# Patient Record
Sex: Female | Born: 2000 | Race: Black or African American | Hispanic: No | Marital: Single | Smoking: Never smoker
Health system: Southern US, Community
[De-identification: ages and names within clinical notes are randomized; demographics above are authoritative.]

## PROBLEM LIST (undated history)

## (undated) DIAGNOSIS — D649 Anemia, unspecified: Secondary | ICD-10-CM

## (undated) HISTORY — PX: NO PAST SURGERIES: SHX2092

---
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
## 2022-12-24 ENCOUNTER — Ambulatory Visit
Admission: EM | Admit: 2022-12-24 | Discharge: 2022-12-24 | Disposition: A | Discharge status: Home / Self Care | Payer: Self-pay | Attending: Urgent Care | Admitting: Urgent Care

## 2022-12-24 DIAGNOSIS — R55 Syncope and collapse: Secondary | ICD-10-CM

## 2022-12-24 DIAGNOSIS — F411 Generalized anxiety disorder: Secondary | ICD-10-CM

## 2022-12-24 LAB — POCT FASTING CBG KUC MANUAL ENTRY: POCT Glucose (KUC): 129 mg/dL — AB (ref 70–99)

## 2022-12-24 MED ORDER — SODIUM CHLORIDE 0.9 % IV BOLUS
1000.0000 mL | Freq: Once | INTRAVENOUS | Status: AC
  Administered 2022-12-24: 1000 mL via INTRAVENOUS

## 2022-12-24 NOTE — ED Provider Notes (Addendum)
Diane Manning    CSN: [id number] Arrival date & time: 12/24/22  1308      History   Chief Complaint Chief Complaint  Patient presents with   Loss of Consciousness    HPI Diane Manning is a 22 y.o. female.    Loss of Consciousness   Patient is accompanied by her brother.  They present to urgent care with complaint of two witnessed syncopal events this morning, the first at work about an hour ago.  She is unsure if she hit her head.  Her brother states that she was evaluated on the scene by EMS but refused transport because she "thought she would get better".  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  She reports feeling weak, nausea.  She endorses headache this morning and using NSAIDs.  She reports previous history of similar symptoms and "stop taking her iron pills". She reports anemia that was discovered by a provider at a routine visit. Denies heavy menstrual periods. Denies dietary restrictions.  She states her symptoms occurred today when she was walking down a fragrance aisle.  She reports sometimes being overcome by heavy fragrances.  She did not eat breakfast this morning but states that is normal for her.  She reports drinking normal amounts of fluids.  She states her symptoms are also sometimes driven by stress and anxiety and panic.  Past Medical History:  Diagnosis Date   Anemia     There are no problems to display for this patient.   History reviewed. No pertinent surgical history.  OB History   No obstetric history on file.      Home Medications    Prior to Admission medications   Medication Sig Start Date End Date Taking? Authorizing Provider  amoxicillin (AMOXIL) 500 MG capsule Take 1 capsule (500 mg total) by mouth 3 (three) times daily. Patient not taking: Reported on 12/24/2022 10/30/21   Elson Areas, PA-C  pediatric multivitamin + iron (POLY-VI-SOL +IRON) 10 MG/ML oral solution Take by mouth  daily. Patient not taking: Reported on 12/24/2022    [provider]    Family History Family History  Problem Relation Age of Onset   Healthy Mother    Healthy Father    Healthy Brother     Social History Social History   Tobacco Use   Smoking status: Never   Smokeless tobacco: Never  Vaping Use   Vaping Use: Never used  Substance Use Topics   Alcohol use: No   Drug use: No     Allergies   Patient has no known allergies.   Review of Systems Review of Systems  Cardiovascular:  Positive for syncope.     Physical Exam Triage Vital Signs ED Triage Vitals  Enc Vitals Group     BP 12/24/22 0932 117/78     Pulse Rate 12/24/22 0932 82     Resp 12/24/22 0932 17     Temp 12/24/22 0936 98.7 F (37.1 C)     Temp src --      SpO2 12/24/22 0932 98 %     Weight --      Height --      Head Circumference --      Peak Flow --      Pain Score 12/24/22 0933 0     Pain Loc --      Pain Edu? --      Excl. in [place]? --    No data  found.  Updated Vital Signs BP 119/80   Pulse 78   Temp 98.7 F (37.1 C)   Resp 18   LMP 12/22/2022   SpO2 98%   Visual Acuity Right Eye Distance:   Left Eye Distance:   Bilateral Distance:    Right Eye Near:   Left Eye Near:    Bilateral Near:     Physical Exam Constitutional:      Appearance: She is ill-appearing.  Cardiovascular:     Rate and Rhythm: Normal rate and regular rhythm.     Pulses: Normal pulses.     Heart sounds: Normal heart sounds.  Pulmonary:     Effort: Pulmonary effort is normal.     Breath sounds: Normal breath sounds.  Skin:    General: Skin is warm and dry.  Neurological:     General: No focal deficit present.     Mental Status: She is alert and oriented to person, place, and time.  Psychiatric:        Mood and Affect: Mood normal.        Behavior: Behavior normal.      UC Treatments / Results  Labs (all labs ordered are listed, but only abnormal results are displayed) Labs Reviewed   POCT FASTING CBG [facility] MANUAL ENTRY - Abnormal; Notable for the following components:      Result Value   POCT Glucose ([facility]) 129 (*)    All other components within normal limits    EKG   Radiology No results found.  Procedures Procedures (including critical care time)  Medications Ordered in UC Medications  sodium chloride 0.9 % bolus 1,000 mL (1,000 mLs Intravenous New Bag/Given 12/24/22 0949)    Initial Impression / Assessment and Plan / UC Course  I have reviewed the triage vital signs and the nursing notes.  Pertinent labs & imaging results that were available during my care of the patient were reviewed by me and considered in my medical decision making (see chart for details).   Diane Manning is a 22 y.o. female presenting with syncope. Patient is afebrile without recent antipyretics, satting well on room air. Overall is ill appearing though non-toxic, well hydrated, without respiratory distress. Pulmonary exam is unremarkable.  Lungs CTAB without wheezing, rhonchi, rales. RRR.  ECG is reassuring with normal sinus rhythm.  Ventricular rate of 80 bpm was recorded.  No patient history of chronic issues is in chart.  Presumed IDA history given she states she was using iron supplementation.  Recommending she restart iron supplementation and follow-up with PCP evaluation for IDA.  Stated scheduling an establish care appointment.  Also recommended she seek evaluation at a behavioral health center for anxiety and panic.  Patient was discharged after infusion of 1 L normal saline.  She states she is feeling better.  Counseled patient on potential for adverse effects with medications prescribed/recommended today, ER and return-to-clinic precautions discussed, patient verbalized understanding and agreement with care plan.   Final Clinical Impressions(s) / UC Diagnoses   Final diagnoses:  Syncope and collapse   Discharge Instructions   None    ED Prescriptions   None    PDMP  not reviewed this encounter.   Charma Igo, FNP 12/24/22 1020    ImmordinoJeannett Senior, FNP 12/24/22 1021

## 2022-12-24 NOTE — Discharge Instructions (Addendum)
Recommend eating some food in the morning before work.  Please schedule a visit with your primary care doctor for full evaluation including possible anemia.  I would recommend that you schedule an evaluation at a behavioral health center for your anxiety and panic.

## 2022-12-24 NOTE — ED Notes (Signed)
Appointment scheduled for patient to establish PCP. Patient scheduled with Mort Sawyers NP on August 1st @ 8am.

## 2022-12-24 NOTE — ED Notes (Signed)
Able to ambulate with light assistance from brother upon discharge.

## 2022-12-24 NOTE — ED Triage Notes (Addendum)
Patient to Urgent Care with complaints of two witnessed syncopal events while at work this morning. Unsure if she hit her head- does not remember events. At the time patient was evaluated by [facility] but refused transport to the ER.   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 Patient weak/ nauseated and ill-appearing. A/Ox4. States she had a headache this morning and took some ibuprofen.  Reports history of the same- states she stopped taking her iron pills 3-4 months ago.

## 2023-01-21 ENCOUNTER — Ambulatory Visit
Admission: EM | Admit: 2023-01-21 | Discharge: 2023-01-21 | Disposition: A | Discharge status: Home / Self Care | Payer: Self-pay

## 2023-01-21 DIAGNOSIS — Z7689 Persons encountering health services in other specified circumstances: Secondary | ICD-10-CM

## 2023-01-21 DIAGNOSIS — R55 Syncope and collapse: Secondary | ICD-10-CM

## 2023-01-21 NOTE — ED Provider Notes (Signed)
Renaldo Fiddler    CSN: [id number] Arrival date & time: 01/21/23  4098      History   Chief Complaint No chief complaint on file.   HPI Diane Manning is a 22 y.o. female.   Patient presents for evaluation for a note requesting to return to work after syncopal episode that occurred on 12/24/2022.  Evaluated in this urgent care, deemed stable at that time, restarted on iron tablets due to history of iron deficiency anemia.  Also recommended at that time to follow-up with behavioral health due to known anxiety that she believes may have been contributing to symptoms.  Has follow-up appointment with primary care, unable to be seen until August.  Intermittently still experiencing dizziness but denies additional syncopal episodes.  Past Medical History:  Diagnosis Date   Anemia     There are no problems to display for this patient.   History reviewed. No pertinent surgical history.  OB History   No obstetric history on file.      Home Medications    Prior to Admission medications   Medication Sig Start Date End Date Taking? Authorizing Provider  ferrous sulfate 325 (65 FE) MG EC tablet Take 325 mg by mouth 3 (three) times daily with meals.   Yes [provider]  ibuprofen (ADVIL) 200 MG tablet Take 200 mg by mouth every 6 (six) hours as needed.   Yes [provider]  amoxicillin (AMOXIL) 500 MG capsule Take 1 capsule (500 mg total) by mouth 3 (three) times daily. Patient not taking: Reported on 12/24/2022 10/30/21   Elson Areas, PA-C  pediatric multivitamin + iron (POLY-VI-SOL +IRON) 10 MG/ML oral solution Take by mouth daily. Patient not taking: Reported on 12/24/2022    [provider]    Family History Family History  Problem Relation Age of Onset   Healthy Mother    Healthy Father    Healthy Brother     Social History Social History   Tobacco Use   Smoking status: Never   Smokeless tobacco: Never  Vaping Use   Vaping Use: Never  used  Substance Use Topics   Alcohol use: No   Drug use: No     Allergies   Patient has no known allergies.   Review of Systems Review of Systems   Physical Exam Triage Vital Signs ED Triage Vitals  Enc Vitals Group     BP 01/21/23 0832 115/75     Pulse Rate 01/21/23 0832 77     Resp 01/21/23 0832 19     Temp 01/21/23 0832 98.6 F (37 C)     Temp Source 01/21/23 0832 Oral     SpO2 01/21/23 0832 98 %     Weight --      Height --      Head Circumference --      Peak Flow --      Pain Score 01/21/23 0833 0     Pain Loc --      Pain Edu? --      Excl. in [place]? --    Orthostatic VS for the past 24 hrs:  BP- Lying Pulse- Lying BP- Sitting Pulse- Sitting BP- Standing at 0 minutes Pulse- Standing at 0 minutes  01/21/23 0838 111/74 74 107/74 78 119/82 83  01/21/23 0835 -- -- -- 77 -- 105    Updated Vital Signs BP 115/75 (BP Location: Right Arm)   Pulse 77   Temp 98.6 F (37 C) (Oral)  Resp 19   LMP 01/21/2023 (Exact Date)   SpO2 98%   Visual Acuity Right Eye Distance:   Left Eye Distance:   Bilateral Distance:    Right Eye Near:   Left Eye Near:    Bilateral Near:     Physical Exam Constitutional:      Appearance: Normal appearance.  Eyes:     Extraocular Movements: Extraocular movements intact.  Cardiovascular:     Rate and Rhythm: Normal rate and regular rhythm.     Pulses: Normal pulses.     Heart sounds: Normal heart sounds.  Pulmonary:     Effort: Pulmonary effort is normal.     Breath sounds: Normal breath sounds.  Neurological:     Mental Status: She is alert and oriented to person, place, and time. Mental status is at baseline.      UC Treatments / Results  Labs (all labs ordered are listed, but only abnormal results are displayed) Labs Reviewed - No data to display  EKG   Radiology No results found.  Procedures Procedures (including critical care time)  Medications Ordered in UC Medications - No data to display  Initial  Impression / Assessment and Plan / UC Course  I have reviewed the triage vital signs and the nursing notes.  Pertinent labs & imaging results that were available during my care of the patient were reviewed by me and considered in my medical decision making (see chart for details).  Syncope and collapse, return to work evaluation  Vital signs are stable and patient is in no signs of distress nor toxic appearing, vital signs are stable, orthostatic vital signs negative, on initial triage able to see fluctuation in heart rate with increasing at least 30 beats when changing from a sitting to lying position, recommended follow-up with cardiology and walking referral given, advised keeping upcoming PCP appointment and continuation of iron supplements, may follow-up with this urgent care as needed for any concerning symptoms Final Clinical Impressions(s) / UC Diagnoses   Final diagnoses:  Syncope and collapse  Return to work evaluation     Discharge Instructions      Change has been noticed when you change positions from sitting to lying, heart rate increased significantly since you have recently passed out twice, I would like you to follow-up with cardiology for further evaluation, please keep upcoming primary care appointment as well  Continue take your iron tablets as directed  You may return to work and follow-up with his urgent care as needed   ED Prescriptions   None    PDMP not reviewed this encounter.   Valinda Hoar, [place] 01/21/23 [phone]

## 2023-01-21 NOTE — Discharge Instructions (Addendum)
Change has been noticed when you change positions from sitting to lying, heart rate increased significantly since you have recently passed out twice, I would like you to follow-up with cardiology for further evaluation, please keep upcoming primary care appointment as well  Continue take your iron tablets as directed  You may return to work and follow-up with his urgent care as needed

## 2023-01-21 NOTE — ED Triage Notes (Signed)
Pt presents to get doctors note to return to work. Pt states she feels comfortable going back to work. No recent passing out in past 2 weeks. Does still have headaches and feeling dizzy at times if she get up to fast but no passing out or feeling of going to pass out.

## 2023-03-17 ENCOUNTER — Ambulatory Visit (INDEPENDENT_AMBULATORY_CARE_PROVIDER_SITE_OTHER): Payer: Self-pay | Admitting: Family

## 2023-03-17 ENCOUNTER — Encounter: Payer: Self-pay | Admitting: Family

## 2023-03-17 VITALS — BP 100/66 | HR 84 | Temp 98.4°F | Ht 66.5 in | Wt 130.0 lb

## 2023-03-17 DIAGNOSIS — D500 Iron deficiency anemia secondary to blood loss (chronic): Secondary | ICD-10-CM

## 2023-03-17 DIAGNOSIS — R42 Dizziness and giddiness: Secondary | ICD-10-CM

## 2023-03-17 DIAGNOSIS — R55 Syncope and collapse: Secondary | ICD-10-CM

## 2023-03-17 DIAGNOSIS — R000 Tachycardia, unspecified: Secondary | ICD-10-CM

## 2023-03-17 LAB — CBC
HCT: 34.8 % — ABNORMAL LOW (ref 35.0–45.0)
Hemoglobin: 11 g/dL — ABNORMAL LOW (ref 11.7–15.5)
MCH: 25.9 pg — ABNORMAL LOW (ref 27.0–33.0)
MCHC: 31.6 g/dL — ABNORMAL LOW (ref 32.0–36.0)
MCV: 81.9 fL (ref 80.0–100.0)
MPV: 11.4 fL (ref 7.5–12.5)
Platelets: 181 10*3/uL (ref 140–400)
RBC: 4.25 10*6/uL (ref 3.80–5.10)
RDW: 16.5 % — ABNORMAL HIGH (ref 11.0–15.0)
WBC: 4.8 10*3/uL (ref 3.8–10.8)

## 2023-03-17 NOTE — Assessment & Plan Note (Signed)
Recurrent Reviewed 12/24/22 EKG NSR  Pt given information for cardiology offices to call  Self pay labs today, ordered b12 cbc bmp tsh and ibc ferritin pending results.  Encouraged pt to eat every 2-3 hours suspect hypoglycemia from lack of eating throughout the day.

## 2023-03-17 NOTE — Progress Notes (Signed)
Established Patient Office Visit  Subjective:  Patient ID: Eyvonne Teichman, female    DOB: 22-Apr-2001  Age: 22 y.o. MRN: [id number]  CC:  Chief Complaint  Patient presents with   Establish Care    Patient states she was seen in ER in June and was told to get a Rx for Iron from her PCP    HPI Aily Alonso is here for a transition of care visit.  Prior provider was: Dr. Tana Conch [facility] in [place].   Recently found to have IDA in the urgent care at [place]. She went to ER due to syncope and collapse.  Had initially went to urgent care and had a near syncopal event there as well.  She had had two witnessed syncopal events this morning, she states prior to episodes she has blurred vision and fees her chest get tight and then she passes out. She reports similar episodes prior and feels they are due to her stopping the iron pills. No heavy menses. She states she often skips meals and at   Yesterday at work she did lose her balance and her co worker had to catch her from falling.  She states typically if she sits down and cools off after about thirty minutes she will feel better but she states dizziness has becoming more so. She states yesterday during the episode her teeth felt 'fuzzy' as well.   She does often have repeated anxiety panic attacks even when these episodes are not occurring.   She does state this happened about four years ago for the first time.   She did go to the urgent care in [place] again 6/7 for similar episode. EKG was performed in ER and was seen to have fluctuation in heart rate with elevation of heart rate when changing from sitting to lying position, was given referral to a cardiologist.        Past Medical History:  Diagnosis Date   Anemia     Past Surgical History:  Procedure Laterality Date   NO PAST SURGERIES      Family History  Problem Relation Age of Onset   Healthy Mother    Healthy Father    Healthy Brother     Social History    Socioeconomic History   Marital status: Single    Spouse name: Not on file   Number of children: 0   Years of education: Not on file   Highest education level: Not on file  Occupational History   Occupation: [employer]  Tobacco Use   Smoking status: Never   Smokeless tobacco: Never  Vaping Use   Vaping status: Never Used  Substance and Sexual Activity   Alcohol use: No   Drug use: Yes    Types: Marijuana    Comment: occasionally   Sexual activity: Not Currently    Partners: Male    Birth control/protection: Condom  Other Topics Concern   Not on file  Social History Narrative   Not on file   Social Determinants of Health   Financial Resource Strain: Not on file  Food Insecurity: Not on file  Transportation Needs: Not on file  Physical Activity: Not on file  Stress: Not on file  Social Connections: Not on file  Intimate Partner Violence: Not on file    Outpatient Medications Prior to Visit  Medication Sig Dispense Refill   ibuprofen (ADVIL) 200 MG tablet Take 200 mg by mouth every 6 (six) hours as needed.     amoxicillin (AMOXIL)  500 MG capsule Take 1 capsule (500 mg total) by mouth 3 (three) times daily. (Patient not taking: Reported on 12/24/2022) 30 capsule 0   ferrous sulfate 325 (65 FE) MG EC tablet Take 325 mg by mouth 3 (three) times daily with meals.     pediatric multivitamin + iron (POLY-VI-SOL +IRON) 10 MG/ML oral solution Take by mouth daily. (Patient not taking: Reported on 12/24/2022)     No facility-administered medications prior to visit.    No Known Allergies  ROS: Pertinent symptoms negative unless otherwise noted in HPI      Objective:    Physical Exam Vitals reviewed.  Constitutional:      Appearance: Normal appearance.  Eyes:     General:        Right eye: No discharge.        Left eye: No discharge.     Conjunctiva/sclera: Conjunctivae normal.  Cardiovascular:     Rate and Rhythm: Normal rate.  Pulmonary:     Effort: Pulmonary  effort is normal. No respiratory distress.  Musculoskeletal:        General: Normal range of motion.     Cervical back: Normal range of motion.  Neurological:     General: No focal deficit present.     Mental Status: She is alert and oriented to person, place, and time. Mental status is at baseline.     Cranial Nerves: Cranial nerves 2-12 are intact.  Psychiatric:        Mood and Affect: Mood normal.        Behavior: Behavior normal.        Thought Content: Thought content normal.        Judgment: Judgment normal.       BP 100/66 (BP Location: Left Arm, Patient Position: Sitting, Cuff Size: Normal)   Pulse 84   Temp 98.4 F (36.9 C) (Temporal)   Ht 5' 6.5" (1.689 m)   Wt 130 lb (59 kg)   LMP 02/17/2023 (Approximate)   SpO2 99%   BMI 20.67 kg/m  Wt Readings from Last 3 Encounters:  03/17/23 130 lb (59 kg)  07/21/17 123 lb (55.8 kg) (67%, Z= 0.45)*  12/12/15 112 lb (50.8 kg) (69%, Z= 0.49)*   * Growth percentiles are based on CDC (Girls, 2-20 Years) data.     Health Maintenance Due  Topic Date Due   HIV Screening  Never done   Hepatitis C Screening  Never done   COVID-19 Vaccine (3 - 2023-24 season) 04/16/2022   INFLUENZA VACCINE  03/17/2023    There are no preventive care reminders to display for this patient.   No results found for: "TSH" No results found for: "WBC", "HGB", "HCT", "MCV", "PLT" No results found for: "NA", "K", "CHLORIDE", "CO2", "GLUCOSE", "BUN", "CREATININE", "BILITOT", "ALKPHOS", "AST", "ALT", "PROT", "ALBUMIN", "CALCIUM", "ANIONGAP", "EGFR", "GFR" No results found for: "CHOL" No results found for: "HDL" No results found for: "LDLCALC" No results found for: "TRIG" No results found for: "CHOLHDL" No results found for: "HGBA1C"    Assessment & Plan:   Iron deficiency anemia due to chronic blood loss -     Ambulatory referral to Cardiology -     IBC + Ferritin -     Basic metabolic panel -     CBC -     Iron, Total/Total Iron Binding  Cap -     Ferritin -     Transferrin  Tachycardia -     Ambulatory referral to Cardiology -  Basic metabolic panel  Syncope and collapse Assessment & Plan: Recurrent Reviewed 12/24/22 EKG NSR  Pt given information for cardiology offices to call  Self pay labs today, ordered b12 cbc bmp tsh and ibc ferritin pending results.  Encouraged pt to eat every 2-3 hours suspect hypoglycemia from lack of eating throughout the day.    Orders: -     IBC + Ferritin -     Basic metabolic panel -     CBC -     Vitamin B12 -     TSH  Dizziness -     Basic metabolic panel -     Vitamin B12    No orders of the defined types were placed in this encounter.   Follow-up: Return if symptoms worsen or fail to improve.    Mort Sawyers, FNP

## 2023-03-17 NOTE — Patient Instructions (Addendum)
------------------------------------    You can call cardiology office to get scheduled. Ask about self pay options.  This is a cardio office in [place]:   Address: [address], [place], [place] [address] Hours:  Open ? Closes 5?PM Phone: [phone]  ------------------------------------  Start over the counter b12 1000 mcg once daily  Start ferrous gluconate (iron) 325/65 mg over the counter.  Try to be consistent.  Also consider a multivitamin.   Eat every 2-3 hours even if only small meals to try to keep blood sugar even.    Regards,   Mort Sawyers FNP-C

## 2023-03-21 ENCOUNTER — Other Ambulatory Visit: Payer: Self-pay | Admitting: Family

## 2023-03-21 DIAGNOSIS — R55 Syncope and collapse: Secondary | ICD-10-CM

## 2023-03-21 DIAGNOSIS — R42 Dizziness and giddiness: Secondary | ICD-10-CM

## 2023-03-21 DIAGNOSIS — D500 Iron deficiency anemia secondary to blood loss (chronic): Secondary | ICD-10-CM

## 2023-03-21 DIAGNOSIS — R000 Tachycardia, unspecified: Secondary | ICD-10-CM

## 2023-03-30 NOTE — Progress Notes (Signed)
Noted  

## 2023-09-16 ENCOUNTER — Emergency Department (HOSPITAL_COMMUNITY): Payer: BC Managed Care – PPO

## 2023-09-16 ENCOUNTER — Encounter (HOSPITAL_COMMUNITY): Payer: Self-pay

## 2023-09-16 ENCOUNTER — Ambulatory Visit (HOSPITAL_COMMUNITY)
Admission: EM | Admit: 2023-09-16 | Discharge: 2023-09-16 | Disposition: A | Discharge status: Home / Self Care | Payer: BC Managed Care – PPO | Attending: Emergency Medicine | Admitting: Emergency Medicine

## 2023-09-16 ENCOUNTER — Emergency Department (HOSPITAL_COMMUNITY)
Admission: EM | Admit: 2023-09-16 | Discharge: 2023-09-16 | Disposition: A | Discharge status: Home / Self Care | Payer: BC Managed Care – PPO | Attending: Emergency Medicine | Admitting: Emergency Medicine

## 2023-09-16 DIAGNOSIS — J09X2 Influenza due to identified novel influenza A virus with other respiratory manifestations: Secondary | ICD-10-CM | POA: Insufficient documentation

## 2023-09-16 DIAGNOSIS — R0602 Shortness of breath: Secondary | ICD-10-CM | POA: Diagnosis present

## 2023-09-16 DIAGNOSIS — R197 Diarrhea, unspecified: Secondary | ICD-10-CM | POA: Diagnosis not present

## 2023-09-16 DIAGNOSIS — J111 Influenza due to unidentified influenza virus with other respiratory manifestations: Secondary | ICD-10-CM

## 2023-09-16 DIAGNOSIS — R5383 Other fatigue: Secondary | ICD-10-CM

## 2023-09-16 DIAGNOSIS — R55 Syncope and collapse: Secondary | ICD-10-CM | POA: Diagnosis not present

## 2023-09-16 DIAGNOSIS — R1084 Generalized abdominal pain: Secondary | ICD-10-CM | POA: Diagnosis not present

## 2023-09-16 DIAGNOSIS — Z20822 Contact with and (suspected) exposure to covid-19: Secondary | ICD-10-CM | POA: Diagnosis not present

## 2023-09-16 DIAGNOSIS — R112 Nausea with vomiting, unspecified: Secondary | ICD-10-CM | POA: Diagnosis not present

## 2023-09-16 LAB — TSH: TSH: 1.002 u[IU]/mL (ref 0.350–4.500)

## 2023-09-16 LAB — COMPREHENSIVE METABOLIC PANEL
ALT: 27 U/L (ref 0–44)
AST: 36 U/L (ref 15–41)
Albumin: 3.9 g/dL (ref 3.5–5.0)
Alkaline Phosphatase: 51 U/L (ref 38–126)
Anion gap: 13 (ref 5–15)
BUN: <5 mg/dL — ABNORMAL LOW (ref 6–20)
CO2: 23 mmol/L (ref 22–32)
Calcium: 9.1 mg/dL (ref 8.9–10.3)
Chloride: 101 mmol/L (ref 98–111)
Creatinine, Ser: 0.75 mg/dL (ref 0.44–1.00)
GFR, Estimated: >60 mL/min (ref >60)
Glucose, Bld: 105 mg/dL — ABNORMAL HIGH (ref 70–99)
Potassium: 4.2 mmol/L (ref 3.5–5.1)
Sodium: 137 mmol/L (ref 135–145)
Total Bilirubin: 0.6 mg/dL (ref 0.0–1.2)
Total Protein: 8.2 g/dL — ABNORMAL HIGH (ref 6.5–8.1)

## 2023-09-16 LAB — CBC WITH DIFFERENTIAL/PLATELET
Abs Immature Granulocytes: 0 10*3/uL (ref 0.00–0.07)
Basophils Absolute: 0 10*3/uL (ref 0.0–0.1)
Basophils Relative: 0 %
Eosinophils Absolute: 0 10*3/uL (ref 0.0–0.5)
Eosinophils Relative: 0 %
HCT: 40.1 % (ref 36.0–46.0)
Hemoglobin: 13.3 g/dL (ref 12.0–15.0)
Lymphocytes Relative: 62 %
Lymphs Abs: 2 10*3/uL (ref 0.7–4.0)
MCH: 26.4 pg (ref 26.0–34.0)
MCHC: 33.2 g/dL (ref 30.0–36.0)
MCV: 79.7 fL — ABNORMAL LOW (ref 80.0–100.0)
Monocytes Absolute: 0.3 10*3/uL (ref 0.1–1.0)
Monocytes Relative: 10 %
Neutro Abs: 0.9 10*3/uL — ABNORMAL LOW (ref 1.7–7.7)
Neutrophils Relative %: 28 %
Platelets: 163 10*3/uL (ref 150–400)
RBC: 5.03 MIL/uL (ref 3.87–5.11)
RDW: 15.9 % — ABNORMAL HIGH (ref 11.5–15.5)
WBC: 3.3 10*3/uL — ABNORMAL LOW (ref 4.0–10.5)
nRBC: 0 % (ref 0.0–0.2)

## 2023-09-16 LAB — CBG MONITORING, ED: Glucose-Capillary: 82 mg/dL (ref 70–99)

## 2023-09-16 LAB — RESP PANEL BY RT-PCR (RSV, FLU A&B, COVID)  RVPGX2
Influenza A by PCR: POSITIVE — AB
Influenza B by PCR: NEGATIVE
Resp Syncytial Virus by PCR: NEGATIVE
SARS Coronavirus 2 by RT PCR: NEGATIVE

## 2023-09-16 LAB — D-DIMER, QUANTITATIVE: D-Dimer, Quant: 0.66 ug{FEU}/mL — ABNORMAL HIGH (ref 0.00–0.50)

## 2023-09-16 LAB — HCG, QUANTITATIVE, PREGNANCY: hCG, Beta Chain, Quant, S: <1 m[IU]/mL (ref <5)

## 2023-09-16 LAB — MAGNESIUM: Magnesium: 2.1 mg/dL (ref 1.7–2.4)

## 2023-09-16 LAB — VITAMIN B12: Vitamin B-12: 879 pg/mL (ref 180–914)

## 2023-09-16 MED ORDER — ONDANSETRON 4 MG PO TBDP
4.0000 mg | ORAL_TABLET | Freq: Once | ORAL | Status: AC
  Administered 2023-09-16: 4 mg via ORAL
  Filled 2023-09-16: qty 1

## 2023-09-16 MED ORDER — ONDANSETRON 4 MG PO TBDP
4.0000 mg | ORAL_TABLET | Freq: Three times a day (TID) | ORAL | 0 refills | Status: DC | PRN
Start: 2023-09-16 — End: 2024-06-29

## 2023-09-16 MED ORDER — ONDANSETRON 4 MG PO TBDP
ORAL_TABLET | ORAL | Status: AC
  Filled 2023-09-16: qty 1

## 2023-09-16 MED ORDER — ONDANSETRON 4 MG PO TBDP
4.0000 mg | ORAL_TABLET | Freq: Once | ORAL | Status: AC
  Administered 2023-09-16: 4 mg via ORAL

## 2023-09-16 MED ORDER — ACETAMINOPHEN 500 MG PO TABS
1000.0000 mg | ORAL_TABLET | Freq: Once | ORAL | Status: AC
  Administered 2023-09-16: 1000 mg via ORAL
  Filled 2023-09-16: qty 2

## 2023-09-16 MED ORDER — ONDANSETRON 4 MG PO TBDP: 4.0000 mg | ORAL_TABLET | Freq: Once | ORAL | Status: DC

## 2023-09-16 MED ORDER — SODIUM CHLORIDE 0.9 % IV BOLUS
1000.0000 mL | Freq: Once | INTRAVENOUS | Status: AC
  Administered 2023-09-16: 1000 mL via INTRAVENOUS

## 2023-09-16 MED ORDER — IOHEXOL 350 MG/ML SOLN
75.0000 mL | Freq: Once | INTRAVENOUS | Status: AC | PRN
  Administered 2023-09-16: 75 mL via INTRAVENOUS

## 2023-09-16 NOTE — ED Provider Notes (Signed)
[facility]    CSN: [id number] Arrival date & time: 09/16/23  1038      History   Chief Complaint No chief complaint on file.   HPI Diane Manning is a 23 y.o. female.   Patient presents with cough, headache, and bodyaches that began about a week ago.  Patient states that she has been nonstop vomiting and diarrhea x 6 days.  Patient also endorses intermittent abdominal pain/cramping.  Patient states she passed out about 2 days ago.  Patient was pulled back to triage due to passing out in the lobby.  No injuries upon passing out.     Past Medical History:  Diagnosis Date   Anemia     Patient Active Problem List   Diagnosis Date Noted   Iron deficiency anemia due to chronic blood loss 03/17/2023   Syncope and collapse 03/17/2023   Tachycardia 03/17/2023   Dizziness 03/17/2023    Past Surgical History:  Procedure Laterality Date   NO PAST SURGERIES      OB History   No obstetric history on file.      Home Medications    Prior to Admission medications   Medication Sig Start Date End Date Taking? Authorizing Provider  ibuprofen (ADVIL) 200 MG tablet Take 200 mg by mouth every 6 (six) hours as needed.    [provider]    Family History Family History  Problem Relation Age of Onset   Healthy Mother    Healthy Father    Healthy Brother     Social History Social History   Tobacco Use   Smoking status: Never   Smokeless tobacco: Never  Vaping Use   Vaping status: Never Used  Substance Use Topics   Alcohol use: No   Drug use: Yes    Types: Marijuana    Comment: occasionally     Allergies   Patient has no known allergies.   Review of Systems Review of Systems  Constitutional:  Positive for chills, fatigue and fever.  HENT:  Positive for congestion.   Respiratory:  Positive for cough. Negative for chest tightness and shortness of breath.   Gastrointestinal:  Positive for abdominal pain, diarrhea, nausea and vomiting.   Neurological:  Positive for syncope and headaches.     Physical Exam Triage Vital Signs ED Triage Vitals [09/16/23 1115]  Encounter Vitals Group     BP (!) 136/99     Systolic BP Percentile      Diastolic BP Percentile      Pulse      Resp 18     Temp      Temp src      SpO2 94 %     Weight      Height      Head Circumference      Peak Flow      Pain Score      Pain Loc      Pain Education      Exclude from Growth Chart    No data found.  Updated Vital Signs BP (!) 136/99 (BP Location: Left Arm)   Pulse (!) 101   Temp 99.5 F (37.5 C) (Oral)   Resp 18   LMP 09/16/2023 (Approximate)   SpO2 94%   Visual Acuity Right Eye Distance:   Left Eye Distance:   Bilateral Distance:    Right Eye Near:   Left Eye Near:    Bilateral Near:     Physical Exam Vitals and nursing note  reviewed.  Constitutional:      General: She is awake. She is not in acute distress.    Appearance: Normal appearance. She is well-developed and well-groomed. She is ill-appearing.  Cardiovascular:     Rate and Rhythm: Normal rate and regular rhythm.  Pulmonary:     Effort: Pulmonary effort is normal.     Breath sounds: Normal breath sounds.  Abdominal:     General: Abdomen is flat. Bowel sounds are normal.     Palpations: Abdomen is soft.     Tenderness: There is generalized abdominal tenderness. There is guarding.  Skin:    General: Skin is warm and dry.  Neurological:     Mental Status: She is alert.  Psychiatric:        Behavior: Behavior is cooperative.      UC Treatments / Results  Labs (all labs ordered are listed, but only abnormal results are displayed) Labs Reviewed - No data to display  EKG   Radiology No results found.  Procedures Procedures (including critical care time)  Medications Ordered in UC Medications  ondansetron (ZOFRAN-ODT) disintegrating tablet 4 mg (4 mg Oral Given 09/16/23 1122)    Initial Impression / Assessment and Plan / UC Course  I  have reviewed the triage vital signs and the nursing notes.  Pertinent labs & imaging results that were available during my care of the patient were reviewed by me and considered in my medical decision making (see chart for details).     Patient presented with cough, headache, body aches, and fever that began about a week ago.  Patient reports she has been constantly vomiting and diarrhea x 6 days with intermittent abdominal pain/cramping.   Patient was pulled back in triage early due to passing out in the lobby.  Patient states she also passed out about 2 days ago.  No injuries noted.  Upon assessment patient is ill-appearing and is actively vomiting.  ODT Zofran given without relief of nausea and vomiting.  Patient is  tender to generalized abdomen with guarding.   Recommended patient be seen in ER for further evaluation due to multiple syncopal episodes and symptoms possibly requiring CT scan.  Patient agreeable to plan at this time.  Patient had a family member who drove them here today.  Patient is stable enough at this time to arrive via POV. Final Clinical Impressions(s) / UC Diagnoses   Final diagnoses:  Nausea, vomiting, and diarrhea  Generalized abdominal pain  Syncope, unspecified syncope type     Discharge Instructions      Go to ER for further evaluation.      ED Prescriptions   None    PDMP not reviewed this encounter.   Wynonia Lawman A, NP 09/16/23 1134

## 2023-09-16 NOTE — ED Notes (Signed)
Pt d/c home with visitor who is discharge ride home. Discharge summary reviewed, verbalize understanding. Ambulatory off unit.

## 2023-09-16 NOTE — ED Triage Notes (Signed)
Patient presents to the office for near syncope episode, vomiting and abdominal pain. Patient states low grade temp for several days. Patient states she was expose to the flu.

## 2023-09-16 NOTE — Discharge Instructions (Signed)
 Go to ER for further evaluation .

## 2023-09-16 NOTE — ED Provider Notes (Incomplete)
[place] EMERGENCY DEPARTMENT AT [facility] Provider Note   CSN: [id number] Arrival date & time: 09/16/23  1135     History {Add pertinent medical, surgical, social history, OB history to HPI:1} Chief Complaint  Patient presents with   Fatigue   Influenza    Diane Manning is a 23 y.o. female.  HPI     For one week has had symptoms of cough, headache, bodyaches, shortness of breath, vomiting and diarrhea.  Diarrhea just started a few days ago, would go 4-5 times a day, no blak or bloody Before that was not going to the bathroom 4 days.   Vomiting  5-6 times a day for the last week,    Home Medications Prior to Admission medications   Medication Sig Start Date End Date Taking? Authorizing Provider  ibuprofen (ADVIL) 200 MG tablet Take 400 mg by mouth 2 (two) times daily as needed for headache, fever or moderate pain (pain score 4-6).   Yes [provider]      Allergies    Patient has no known allergies.    Review of Systems   Review of Systems  Physical Exam Updated Vital Signs BP (!) 121/92 (BP Location: Left Arm)   Pulse 98   Temp 98.4 F (36.9 C) (Oral)   Resp 17   LMP 09/16/2023 (Approximate)   SpO2 96%  Physical Exam  ED Results / Procedures / Treatments   Labs (all labs ordered are listed, but only abnormal results are displayed) Labs Reviewed  RESP PANEL BY RT-PCR (RSV, FLU A&B, COVID)  RVPGX2 - Abnormal; Notable for the following components:      Result Value   Influenza A by PCR POSITIVE (*)    All other components within normal limits  CBC WITH DIFFERENTIAL/PLATELET - Abnormal; Notable for the following components:   WBC 3.3 (*)    MCV 79.7 (*)    RDW 15.9 (*)    Neutro Abs 0.9 (*)    All other components within normal limits  COMPREHENSIVE METABOLIC PANEL - Abnormal; Notable for the following components:   Glucose, Bld 105 (*)    BUN <5 (*)    Total Protein 8.2 (*)    All other components within normal limits  D-DIMER,  QUANTITATIVE - Abnormal; Notable for the following components:   D-Dimer, Quant 0.66 (*)    All other components within normal limits  TSH  VITAMIN B12  MAGNESIUM  HCG, QUANTITATIVE, PREGNANCY  CBG MONITORING, ED    EKG None  Radiology CT Angio Chest PE W/Cm &/Or Wo Cm Result Date: 09/16/2023 CLINICAL DATA:  Near syncopal episode.  Vomiting and abdominal pain. EXAM: CT ANGIOGRAPHY CHEST WITH CONTRAST TECHNIQUE: Multidetector CT imaging of the chest was performed using the standard protocol during bolus administration of intravenous contrast. Multiplanar CT image reconstructions and MIPs were obtained to evaluate the vascular anatomy. RADIATION DOSE REDUCTION: This exam was performed according to the departmental dose-optimization program which includes automated exposure control, adjustment of the mA and/or kV according to patient size and/or use of iterative reconstruction technique. CONTRAST:  75mL OMNIPAQUE IOHEXOL 350 MG/ML SOLN COMPARISON:  Same day chest radiograph dated 09/16/2023. FINDINGS: Cardiovascular: Satisfactory opacification of the pulmonary arteries to the segmental level. No evidence of pulmonary embolism. Normal heart size. No pericardial effusion. Thoracic aorta is normal in caliber. Mediastinum/Nodes: No enlarged mediastinal, hilar, or axillary lymph nodes. Trachea and esophagus demonstrate no significant findings. 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 Lungs/Pleura: The lungs appear well aerated. Mild motion degradation at the lung bases. A 3 mm nodular density along the left major fissure is favored to represent an intra fissural lymph node. No suspicious pulmonary nodule. No focal consolidation, pleural effusion, or pneumothorax. Upper Abdomen: No acute abnormality. Musculoskeletal: No chest wall abnormality. No acute or significant osseous findings. Review of the MIP images confirms the above findings. IMPRESSION: 1.  No evidence of pulmonary embolism. 2. No acute intrathoracic findings. Electronically Signed   By: Hart Robinsons M.D.   On: 09/16/2023 16:13   DG Chest 2 View Result Date: 09/16/2023 CLINICAL DATA:  Shortness of breath. EXAM: CHEST - 2 VIEW COMPARISON:  None Available. FINDINGS: The heart size and mediastinal contours are within normal limits. Both lungs are clear. The visualized skeletal structures are unremarkable. IMPRESSION: No active cardiopulmonary disease. Electronically Signed   By: Lupita Raider M.D.   On: 09/16/2023 13:36    Procedures Procedures  {Document cardiac monitor, telemetry assessment procedure when appropriate:1}  Medications Ordered in ED Medications  ondansetron (ZOFRAN-ODT) disintegrating tablet 4 mg (4 mg Oral Patient Refused/Not Given 09/16/23 1704)  ondansetron (ZOFRAN-ODT) disintegrating tablet 4 mg (4 mg Oral Given 09/16/23 1234)  acetaminophen (TYLENOL) tablet 1,000 mg (1,000 mg Oral Given 09/16/23 1234)  iohexol (OMNIPAQUE) 350 MG/ML injection 75 mL (75 mLs Intravenous Contrast Given 09/16/23 1519)  sodium chloride 0.9 % bolus 1,000 mL (1,000 mLs Intravenous New Bag/Given 09/16/23 1703)    ED Course/ Medical Decision Making/ A&P Clinical Course as of 09/16/23 2001  Fri Sep 16, 2023  1359 Patient had syncopal episodes in lobby. D-dimer elevated. Ordering CTA chest now. [SM]    Clinical Course User Index [SM] [name], PA-C   {   Click here for ABCD2, HEART and other calculatorsREFRESH Note before signing :1}                              Medical Decision Making  ***  {Document critical care time when appropriate:1} {Document review of labs and clinical decision tools ie heart score, Chads2Vasc2 etc:1}  {Document your independent review of radiology images, and any outside records:1} {Document your discussion with family members, caretakers, and with consultants:1} {Document social determinants of health affecting pt's care:1} {Document  your decision making why or why not admission, treatments were needed:1} Final Clinical Impression(s) / ED Diagnoses Final diagnoses:  None    Rx / DC Orders ED Discharge Orders     None

## 2023-09-16 NOTE — ED Triage Notes (Signed)
Pt sent over from [facility] for complaints of flu like symptoms x 1 week. Sibling at home dx with flu. Pt has had n/v/d and unable to eat for 3 days. States she has been on menstrual cycle for over 2 weeks. PT also complains of fatigue.

## 2023-09-16 NOTE — ED Provider Triage Note (Addendum)
Emergency Medicine Provider Triage Evaluation Note  Diane Manning , a 23 y.o. female  was evaluated in triage.  Pt complains of syncope.  Review of Systems  Positive:  Negative:   Physical Exam  BP (!) 121/92 (BP Location: Left Arm)   Pulse 98   Temp 98.4 F (36.9 C) (Oral)   Resp 17   LMP 09/16/2023 (Approximate)   SpO2 100%  Gen:   Awake, no distress   Resp:  Normal effort  MSK:   Moves extremities without difficulty  Other:    Medical Decision Making  Medically screening exam initiated at 12:18 PM.  Appropriate orders placed.  Diane Manning was informed that the remainder of the evaluation will be completed by another provider, this initial triage assessment does not replace that evaluation, and the importance of remaining in the ED until their evaluation is complete.  Cough, rhinorrhea, fever, sore throat, nausea, vomiting diarrhea. Unable to eat x3 days. Patient also feeling SOB and fatigued. Has also been on menstrual cycle x17 days. Patient told by PCP to visit cardiologist for tachycardia in August, but patient has not been able to follow up for this. Patient sent from [facility] for syncopal episode.  Patient also loss consciousness for approx 5 seconds 2 days ago and states that she woke up hyperventilating and all her muscles tensed up and not able to move. She has been having strange contractions in muscles since then. Also with strange tingling in face after the syncopal episode 2 days ago which has since resolved. Denies tongue biting or urinary incontinence. Denies recent head trauma. Has gotten all of childhood vaccinations.    Diane Manning, [place] 09/16/23 1226  D-dimer elevated. Got CTA chest. Patient also with multiple episodes of syncope in lobby. Getting CT head as well.    Diane Manning F, [place] 09/16/23 [phone]

## 2024-05-14 ENCOUNTER — Emergency Department (HOSPITAL_BASED_OUTPATIENT_CLINIC_OR_DEPARTMENT_OTHER): Admission: EM | Admit: 2024-05-14 | Discharge: 2024-05-14 | Disposition: A | Discharge status: Home / Self Care

## 2024-05-14 ENCOUNTER — Other Ambulatory Visit: Payer: Self-pay

## 2024-05-14 ENCOUNTER — Ambulatory Visit: Payer: Self-pay

## 2024-05-14 ENCOUNTER — Encounter (HOSPITAL_BASED_OUTPATIENT_CLINIC_OR_DEPARTMENT_OTHER): Payer: Self-pay

## 2024-05-14 DIAGNOSIS — R519 Headache, unspecified: Secondary | ICD-10-CM | POA: Diagnosis present

## 2024-05-14 DIAGNOSIS — R42 Dizziness and giddiness: Secondary | ICD-10-CM | POA: Insufficient documentation

## 2024-05-14 DIAGNOSIS — N3000 Acute cystitis without hematuria: Secondary | ICD-10-CM | POA: Insufficient documentation

## 2024-05-14 DIAGNOSIS — G43809 Other migraine, not intractable, without status migrainosus: Secondary | ICD-10-CM | POA: Insufficient documentation

## 2024-05-14 LAB — COMPREHENSIVE METABOLIC PANEL WITH GFR
ALT: 15 U/L (ref 0–44)
AST: 23 U/L (ref 15–41)
Albumin: 4.2 g/dL (ref 3.5–5.0)
Alkaline Phosphatase: 65 U/L (ref 38–126)
Anion gap: 10 (ref 5–15)
BUN: 10 mg/dL (ref 6–20)
CO2: 23 mmol/L (ref 22–32)
Calcium: 8.9 mg/dL (ref 8.9–10.3)
Chloride: 106 mmol/L (ref 98–111)
Creatinine, Ser: 0.82 mg/dL (ref 0.44–1.00)
GFR, Estimated: >60 mL/min (ref >60)
Glucose, Bld: 100 mg/dL — ABNORMAL HIGH (ref 70–99)
Potassium: 4.2 mmol/L (ref 3.5–5.1)
Sodium: 138 mmol/L (ref 135–145)
Total Bilirubin: 0.3 mg/dL (ref 0.0–1.2)
Total Protein: 7.1 g/dL (ref 6.5–8.1)

## 2024-05-14 LAB — URINALYSIS, ROUTINE W REFLEX MICROSCOPIC
Bilirubin Urine: NEGATIVE
Glucose, UA: NEGATIVE mg/dL
Ketones, ur: NEGATIVE mg/dL
Nitrite: POSITIVE — AB
Protein, ur: NEGATIVE mg/dL
Specific Gravity, Urine: >=1.03 (ref 1.005–1.030)
pH: 5.5 (ref 5.0–8.0)

## 2024-05-14 LAB — CBC
HCT: 35.5 % — ABNORMAL LOW (ref 36.0–46.0)
Hemoglobin: 11.5 g/dL — ABNORMAL LOW (ref 12.0–15.0)
MCH: 26.9 pg (ref 26.0–34.0)
MCHC: 32.4 g/dL (ref 30.0–36.0)
MCV: 82.9 fL (ref 80.0–100.0)
Platelets: 175 K/uL (ref 150–400)
RBC: 4.28 MIL/uL (ref 3.87–5.11)
RDW: 16.1 % — ABNORMAL HIGH (ref 11.5–15.5)
WBC: 4.9 K/uL (ref 4.0–10.5)
nRBC: 0 % (ref 0.0–0.2)

## 2024-05-14 LAB — URINALYSIS, MICROSCOPIC (REFLEX)

## 2024-05-14 LAB — CBG MONITORING, ED: Glucose-Capillary: 99 mg/dL (ref 70–99)

## 2024-05-14 LAB — HCG, SERUM, QUALITATIVE: Preg, Serum: NEGATIVE

## 2024-05-14 LAB — PREGNANCY, URINE: Preg Test, Ur: NEGATIVE

## 2024-05-14 MED ORDER — CEPHALEXIN 500 MG PO CAPS
500.0000 mg | ORAL_CAPSULE | Freq: Two times a day (BID) | ORAL | 0 refills | Status: DC
Start: 2024-05-14 — End: 2024-05-14

## 2024-05-14 MED ORDER — KETOROLAC TROMETHAMINE 15 MG/ML IJ SOLN
15.0000 mg | Freq: Once | INTRAMUSCULAR | Status: AC
  Administered 2024-05-14: 15 mg via INTRAVENOUS
  Filled 2024-05-14: qty 1

## 2024-05-14 MED ORDER — SODIUM CHLORIDE 0.9 % IV BOLUS
1000.0000 mL | Freq: Once | INTRAVENOUS | Status: AC
  Administered 2024-05-14: 1000 mL via INTRAVENOUS

## 2024-05-14 MED ORDER — DIPHENHYDRAMINE HCL 50 MG/ML IJ SOLN
25.0000 mg | Freq: Once | INTRAMUSCULAR | Status: AC
  Administered 2024-05-14: 25 mg via INTRAVENOUS
  Filled 2024-05-14: qty 1

## 2024-05-14 MED ORDER — CEPHALEXIN 500 MG PO CAPS: 500.0000 mg | ORAL_CAPSULE | Freq: Two times a day (BID) | ORAL | 0 refills | Status: AC

## 2024-05-14 MED ORDER — PROCHLORPERAZINE EDISYLATE 10 MG/2ML IJ SOLN
5.0000 mg | Freq: Once | INTRAMUSCULAR | Status: AC
  Administered 2024-05-14: 5 mg via INTRAVENOUS
  Filled 2024-05-14: qty 2

## 2024-05-14 NOTE — ED Triage Notes (Signed)
 Pt coming from home with complaint of lightheadedness x 2 days with severe headaches x 3 days. Denies N/V/D, CP, SOB. PT awake and alert, NAD noted on triage. Pt reports room feels like its spinning with dizziness. Pt reports hx of same symptoms with previous migraines.

## 2024-05-14 NOTE — Discharge Instructions (Signed)
 For pain, you can take 1000 mg of Tylenol  or 1 g of Tylenol  every 6-8 hours.  Do not exceed more than 4000 mg or 4 g in a 24-hour period.  You can also take ibuprofen  600 to 800 mg every 6-8 hours as well.  Do not take this high-dose ibuprofen  for greater than a week.  ### Migraine Patient Information     Migraine is a common type of headache that can cause severe pain and other symptoms. It affects millions of people and can make it hard to do daily activities.[1]      **What is a migraine?**      A migraine is a headache that usually lasts from 4 hours to 3 days. It often causes throbbing or pulsating pain, usually on one side of the head. Migraines can be moderate to severe and may get worse with movement.[2][3] Other common symptoms include:      - Nausea or vomiting      - Sensitivity to light (photophobia) or sound (phonophobia)      - Feeling tired or confused      - Sometimes, vision changes or aura (flashing lights, zigzag lines, or blind spots) before the headache starts[2][1]      **What triggers migraines?**      Many things can trigger migraines. Common triggers include:      - Stress or anxiety      - Changes in sleep patterns or not getting enough sleep      - Skipping meals or dehydration      - Certain foods (like chocolate, aged cheese, processed meats, alcohol, and caffeine)      - Hormonal changes (such as before or after periods)      - Bright lights or strong smells[3][4][1]      Keeping a headache diary can help identify personal triggers.[5]      **How are migraines treated?**      Treating a migraine early can help stop it from getting worse. The [employer] and American Headache Society recommend starting with these options:[6][7]      - **Acetaminophen  (Tylenol )** or **NSAIDs** (like ibuprofen  or naproxen) for mild to moderate migraines. Ibuprofen  may work slightly better than naproxen or acetaminophen .[3][7][6]

## 2024-05-14 NOTE — ED Provider Notes (Signed)
 [place] [facility] Provider Note   CSN: [id number] Arrival date & time: 05/14/24  1811     Patient presents with: Headache and Dizziness   Diane Manning is a 23 y.o. female.    Headache Associated symptoms: dizziness   Dizziness Associated symptoms: headaches     Patient presents because of headache.  Patient has been having headache for the past 2 days.  History of migraines.  When she gets migraines, she has also has pain behind the right eye.  Did experience an over the past 2 days.  Also endorses some vertigo whenever she moves her head back-and-forth.  No diplopia.  No numbness or tingling aware.  No speech difficulty.  No fever no chills.  No pain in the back of the neck.  Patient states her last menstrual cycle was about 1 to 2 weeks ago.  Denies any chance of being pregnant.  Otherwise, denies all complaints include chest pain shortness of breath nausea vomit diarrhea.  Takes no birth control.     Prior to Admission medications   Medication Sig Start Date End Date Taking? Authorizing Provider  cephALEXin (KEFLEX) 500 MG capsule Take 1 capsule (500 mg total) by mouth 2 (two) times daily for 7 days. 05/14/24 05/21/24  Simon Lavonia SAILOR, MD  ibuprofen  (ADVIL ) 200 MG tablet Take 400 mg by mouth 2 (two) times daily as needed for headache, fever or moderate pain (pain score 4-6).    [provider]  ondansetron  (ZOFRAN -ODT) 4 MG disintegrating tablet Take 1 tablet (4 mg total) by mouth every 8 (eight) hours as needed for nausea or vomiting. 09/16/23   Dreama Longs, MD    Allergies: Patient has no known allergies.    Review of Systems  Neurological:  Positive for dizziness and headaches.    Updated Vital Signs BP 104/64   Pulse 70   Temp 98.4 F (36.9 C) (Oral)   Resp 16   Ht 5' 6 (1.676 m)   Wt 56.7 kg   SpO2 100%   BMI 20.18 kg/m   Physical Exam Vitals and nursing note reviewed.  Constitutional:      General: She is  not in acute distress.    Appearance: She is well-developed.  HENT:     Head: Normocephalic and atraumatic.  Eyes:     Conjunctiva/sclera: Conjunctivae normal.  Cardiovascular:     Rate and Rhythm: Normal rate and regular rhythm.     Heart sounds: No murmur heard. Pulmonary:     Effort: Pulmonary effort is normal. No respiratory distress.     Breath sounds: Normal breath sounds.  Abdominal:     Palpations: Abdomen is soft.     Tenderness: There is no abdominal tenderness.  Musculoskeletal:        General: No swelling.     Cervical back: Neck supple.  Skin:    General: Skin is warm and dry.     Capillary Refill: Capillary refill takes less than 2 seconds.  Neurological:     Mental Status: She is alert.  Psychiatric:        Mood and Affect: Mood normal.     (all labs ordered are listed, but only abnormal results are displayed) Labs Reviewed  COMPREHENSIVE METABOLIC PANEL WITH GFR - Abnormal; Notable for the following components:      Result Value   Glucose, Bld 100 (*)    All other components within normal limits  CBC - Abnormal; Notable for the following  components:   Hemoglobin 11.5 (*)    HCT 35.5 (*)    RDW 16.1 (*)    All other components within normal limits  URINALYSIS, ROUTINE W REFLEX MICROSCOPIC - Abnormal; Notable for the following components:   Hgb urine dipstick TRACE (*)    Nitrite POSITIVE (*)    Leukocytes,Ua SMALL (*)    All other components within normal limits  URINALYSIS, MICROSCOPIC (REFLEX) - Abnormal; Notable for the following components:   Bacteria, UA MANY (*)    All other components within normal limits  PREGNANCY, URINE  HCG, SERUM, QUALITATIVE  CBG MONITORING, ED    EKG: EKG Interpretation Date/Time:  Monday May 14 2024 18:24:24 EDT Ventricular Rate:  89 PR Interval:  128 QRS Duration:  77 QT Interval:  332 QTC Calculation: 404 R Axis:   63  Text Interpretation: Sinus rhythm Confirmed by Simon Rea [phone]) on 05/14/2024  7:16:58 PM  Radiology: No results found.   Procedures   Medications Ordered in the ED  prochlorperazine (COMPAZINE) injection 5 mg (5 mg Intravenous Given 05/14/24 1938)  diphenhydrAMINE (BENADRYL) injection 25 mg (25 mg Intravenous Given 05/14/24 1938)  sodium chloride  0.9 % bolus 1,000 mL (0 mLs Intravenous Stopped 05/14/24 2130)  ketorolac (TORADOL) 15 MG/ML injection 15 mg (15 mg Intravenous Given 05/14/24 2037)                                    Medical Decision Making Amount and/or Complexity of Data Reviewed Labs: ordered.  Risk Prescription drug management.   Patient presents because of headache.  Patient has been having headache for the past 2 days.  History of migraines.  When she gets migraines, she has also has pain behind the right eye.  Did experience an over the past 2 days.  Also endorses some vertigo whenever she moves her head back-and-forth.  No diplopia.  No numbness or tingling aware.  No speech difficulty.  No fever no chills.  No pain in the back of the neck.  Patient states her last menstrual cycle was about 1 to 2 weeks ago.  Denies any chance of being pregnant.  Otherwise, denies all complaints include chest pain shortness of breath nausea vomit diarrhea.  Takes no birth control.   Upon exam, patient hemodynamically stable.  ANO x 3 GCS 15.  NIH is 0.  No concerns for CVA.  No concerns for account of significant vascular pathology such as dural venous sinus in process.  No risk factors for this.  Does not fit demographic for idiopathic intracranial hypertension.  Seems more likely to be migrainous in nature.  Patient received migraine cocktail including Compazine, Benadryl, fluid and Toradol.  Headache is completely resolved no ongoing headache.  ANO x 3 GCS 15.  Patient does have what looks like a UTI.  Start patient on Keflex outpatient.     Final diagnoses:  Other migraine without status migrainosus, not intractable  Acute cystitis without hematuria     ED Discharge Orders          Ordered    cephALEXin (KEFLEX) 500 MG capsule  2 times daily,   Status:  Discontinued        05/14/24 2156    cephALEXin (KEFLEX) 500 MG capsule  2 times daily        05/14/24 2206               Simon Rea  N, MD 05/14/24 2328

## 2024-06-29 ENCOUNTER — Emergency Department
Admission: EM | Admit: 2024-06-29 | Discharge: 2024-06-29 | Disposition: A | Discharge status: Home / Self Care | Attending: Emergency Medicine | Admitting: Emergency Medicine

## 2024-06-29 ENCOUNTER — Other Ambulatory Visit: Payer: Self-pay

## 2024-06-29 ENCOUNTER — Emergency Department

## 2024-06-29 DIAGNOSIS — O219 Vomiting of pregnancy, unspecified: Secondary | ICD-10-CM | POA: Diagnosis present

## 2024-06-29 DIAGNOSIS — N949 Unspecified condition associated with female genital organs and menstrual cycle: Secondary | ICD-10-CM

## 2024-06-29 DIAGNOSIS — R102 Pelvic and perineal pain unspecified side: Secondary | ICD-10-CM | POA: Diagnosis not present

## 2024-06-29 DIAGNOSIS — Z3A08 8 weeks gestation of pregnancy: Secondary | ICD-10-CM | POA: Insufficient documentation

## 2024-06-29 LAB — CBC WITH DIFFERENTIAL/PLATELET
Abs Immature Granulocytes: 0.01 K/uL (ref 0.00–0.07)
Basophils Absolute: 0 K/uL (ref 0.0–0.1)
Basophils Relative: 0 %
Eosinophils Absolute: 0 K/uL (ref 0.0–0.5)
Eosinophils Relative: 0 %
HCT: 38.6 % (ref 36.0–46.0)
Hemoglobin: 12.5 g/dL (ref 12.0–15.0)
Immature Granulocytes: 0 %
Lymphocytes Relative: 34 %
Lymphs Abs: 1.9 K/uL (ref 0.7–4.0)
MCH: 26.9 pg (ref 26.0–34.0)
MCHC: 32.4 g/dL (ref 30.0–36.0)
MCV: 83 fL (ref 80.0–100.0)
Monocytes Absolute: 0.8 K/uL (ref 0.1–1.0)
Monocytes Relative: 14 %
Neutro Abs: 2.8 K/uL (ref 1.7–7.7)
Neutrophils Relative %: 52 %
Platelets: 212 K/uL (ref 150–400)
RBC: 4.65 MIL/uL (ref 3.87–5.11)
RDW: 17 % — ABNORMAL HIGH (ref 11.5–15.5)
WBC: 5.6 K/uL (ref 4.0–10.5)
nRBC: 0 % (ref 0.0–0.2)

## 2024-06-29 LAB — BASIC METABOLIC PANEL WITH GFR
Anion gap: 10 (ref 5–15)
BUN: 8 mg/dL (ref 6–20)
CO2: 24 mmol/L (ref 22–32)
Calcium: 9.2 mg/dL (ref 8.9–10.3)
Chloride: 99 mmol/L (ref 98–111)
Creatinine, Ser: 0.6 mg/dL (ref 0.44–1.00)
GFR, Estimated: >60 mL/min (ref >60)
Glucose, Bld: 97 mg/dL (ref 70–99)
Potassium: 4.4 mmol/L (ref 3.5–5.1)
Sodium: 133 mmol/L — ABNORMAL LOW (ref 135–145)

## 2024-06-29 LAB — URINALYSIS, ROUTINE W REFLEX MICROSCOPIC
Bilirubin Urine: NEGATIVE
Glucose, UA: NEGATIVE mg/dL
Hgb urine dipstick: NEGATIVE
Ketones, ur: NEGATIVE mg/dL
Leukocytes,Ua: NEGATIVE
Nitrite: NEGATIVE
Protein, ur: 30 mg/dL — AB
Specific Gravity, Urine: 1.026 (ref 1.005–1.030)
pH: 6 (ref 5.0–8.0)

## 2024-06-29 LAB — HCG, QUANTITATIVE, PREGNANCY: hCG, Beta Chain, Quant, S: 97284 m[IU]/mL — ABNORMAL HIGH (ref <5)

## 2024-06-29 LAB — ABO/RH: ABO/RH(D): B POS

## 2024-06-29 LAB — POC URINE PREG, ED: Preg Test, Ur: POSITIVE — AB

## 2024-06-29 MED ORDER — ONDANSETRON 4 MG PO TBDP: 4.0000 mg | ORAL_TABLET | Freq: Three times a day (TID) | ORAL | 0 refills | Status: AC | PRN

## 2024-06-29 NOTE — ED Triage Notes (Signed)
 Pt to ED for c/o abd pain and cramping. Pt [redacted] weeks pregnant. Pt states she had some bleeding 2-3 weeks ago, but no bleeding currently.

## 2024-06-29 NOTE — Discharge Instructions (Addendum)
 Follow-up with a gynecologist for monitoring of the pregnancy. Take the Zofran  and ODT as needed for nausea and vomiting. Tylenol  for pain if needed.  Do not take NSAIDs.  You should also start a prenatal vitamin that is over-the-counter.

## 2024-06-29 NOTE — ED Notes (Signed)
 See triage note  Presents with intermittent abd cramping which started about 4 weeks ago States she is about [redacted] weeks pregnant and has had some vaginal bleeding

## 2024-06-29 NOTE — ED Provider Notes (Signed)
 [facility] Provider Note    Event Date/Time   First MD Initiated Contact with Patient 06/29/24 0840     (approximate)   History   Abdominal Pain   HPI  Diane Manning is a 23 y.o. female with history of anemia, currently [redacted] weeks pregnant, presents emergency department stating she has had a lot of abdominal cramping, happens throughout the day, had some bleeding and spotting 2 to 3 weeks ago, not bleeding at this time.  No vaginal discharge.  Has not been evaluated by a doctor since she became pregnant.      Physical Exam   Triage Vital Signs: ED Triage Vitals  Encounter Vitals Group     BP 06/29/24 0821 119/85     Girls Systolic BP Percentile --      Girls Diastolic BP Percentile --      Boys Systolic BP Percentile --      Boys Diastolic BP Percentile --      Pulse Rate 06/29/24 0821 (!) 113     Resp 06/29/24 0821 16     Temp --      Temp src --      SpO2 06/29/24 0821 100 %     Weight 06/29/24 0825 130 lb (59 kg)     Height 06/29/24 0825 5' 6 (1.676 m)     Head Circumference --      Peak Flow --      Pain Score 06/29/24 0822 8     Pain Loc --      Pain Education --      Exclude from Growth Chart --     Most recent vital signs: Vitals:   06/29/24 0821  BP: 119/85  Pulse: (!) 113  Resp: 16  SpO2: 100%     General: Awake, no distress.   CV:  Good peripheral perfusion. Resp:  Normal effort.  Abd:  No distention.  Minimally tender Other:      ED Results / Procedures / Treatments   Labs (all labs ordered are listed, but only abnormal results are displayed) Labs Reviewed  CBC WITH DIFFERENTIAL/PLATELET - Abnormal; Notable for the following components:      Result Value   RDW 17.0 (*)    All other components within normal limits  URINALYSIS, ROUTINE W REFLEX MICROSCOPIC - Abnormal; Notable for the following components:   Color, Urine YELLOW (*)    APPearance CLEAR (*)    Protein, ur 30 (*)    Bacteria, UA RARE (*)    All  other components within normal limits  BASIC METABOLIC PANEL WITH GFR - Abnormal; Notable for the following components:   Sodium 133 (*)    All other components within normal limits  HCG, QUANTITATIVE, PREGNANCY - Abnormal; Notable for the following components:   hCG, Beta Chain, Quant, S 97,284 (*)    All other components within normal limits  POC URINE PREG, ED - Abnormal; Notable for the following components:   Preg Test, Ur POSITIVE (*)    All other components within normal limits  ABO/RH     EKG     RADIOLOGY Ultrasound OB less than 14 weeks    PROCEDURES:   Procedures  Critical Care:  no Chief Complaint  Patient presents with   Abdominal Pain      MEDICATIONS ORDERED IN ED: Medications - No data to display   IMPRESSION / MDM / ASSESSMENT AND PLAN / ED COURSE  I reviewed the triage vital  signs and the nursing notes.                              Differential diagnosis includes, but is not limited to, ectopic, threatened miscarriage, miscarriage, implantation, round ligament pain, ovarian cyst,  pregnancy  Patient's presentation is most consistent with acute illness / injury with system symptoms.   Labs, imaging ordered  I did offer the patient IV fluids.  She is deferring at this time stating that she can tolerate water.  States morning sickness has been bad but she has been tolerating some liquids.  Labs are reassuring, beta-hCG at 97.284, ABO/Rh is B+ so will not need RhoGAM  Ultrasound OB less than 14 weeks, independent review and interpretation by me as being positive for IUP, radiologist states IUP of 8 weeks.  Follow-up for regular OB monitoring  I did explain findings to patient.  Explained to her that the nausea is coming from the pregnancy.  Zofran  and ODT given for nausea.  For the heartburn she also is feeling she could take Tums, Pepcid AC.  She is in agreement with this treatment plan.  Strict instructions to follow-up with OB/GYN.   Discharged stable condition.  Return if worsening     FINAL CLINICAL IMPRESSION(S) / ED DIAGNOSES   Final diagnoses:  Round ligament pain  Nausea and vomiting in pregnancy     Rx / DC Orders   ED Discharge Orders          Ordered    ondansetron  (ZOFRAN -ODT) 4 MG disintegrating tablet  Every 8 hours PRN        06/29/24 1140             Note:  This document was prepared using Dragon voice recognition software and may include unintentional dictation errors.    Gasper Devere ORN, PA-C 06/29/24 1143    Ernest Ronal BRAVO, MD 06/30/24 0800
